# Patient Record
Sex: Female | Born: 1970
Health system: Southern US, Community
[De-identification: ages and names within clinical notes are randomized; demographics above are authoritative.]

## PROBLEM LIST (undated history)

## (undated) ENCOUNTER — Ambulatory Visit (HOSPITAL_COMMUNITY): Payer: Self-pay

## (undated) HISTORY — PX: ABDOMINAL HYSTERECTOMY: SHX81

---

## 2002-04-18 ENCOUNTER — Emergency Department (HOSPITAL_COMMUNITY): Admission: EM | Admit: 2002-04-18 | Discharge: 2002-04-18 | Payer: Self-pay | Admitting: *Deleted

## 2004-08-24 ENCOUNTER — Other Ambulatory Visit: Admission: RE | Admit: 2004-08-24 | Discharge: 2004-08-24 | Payer: Self-pay | Admitting: Gynecology

## 2004-10-27 ENCOUNTER — Inpatient Hospital Stay (HOSPITAL_COMMUNITY): Admission: RE | Admit: 2004-10-27 | Discharge: 2004-10-29 | Payer: Self-pay | Admitting: Gynecology

## 2004-10-27 ENCOUNTER — Encounter (INDEPENDENT_AMBULATORY_CARE_PROVIDER_SITE_OTHER): Payer: Self-pay | Admitting: *Deleted

## 2005-09-21 ENCOUNTER — Other Ambulatory Visit: Admission: RE | Admit: 2005-09-21 | Discharge: 2005-09-21 | Payer: Self-pay | Admitting: Gynecology

## 2006-09-29 ENCOUNTER — Other Ambulatory Visit: Admission: RE | Admit: 2006-09-29 | Discharge: 2006-09-29 | Payer: Self-pay | Admitting: Gynecology

## 2007-10-02 ENCOUNTER — Other Ambulatory Visit: Admission: RE | Admit: 2007-10-02 | Discharge: 2007-10-02 | Payer: Self-pay | Admitting: Gynecology

## 2008-08-16 ENCOUNTER — Ambulatory Visit: Payer: Self-pay | Admitting: Gynecology

## 2008-09-09 ENCOUNTER — Ambulatory Visit: Payer: Self-pay | Admitting: Gynecology

## 2008-11-19 ENCOUNTER — Encounter: Payer: Self-pay | Admitting: Gynecology

## 2008-11-19 ENCOUNTER — Ambulatory Visit: Payer: Self-pay | Admitting: Gynecology

## 2008-11-19 ENCOUNTER — Other Ambulatory Visit: Admission: RE | Admit: 2008-11-19 | Discharge: 2008-11-19 | Payer: Self-pay | Admitting: Gynecology

## 2008-11-25 ENCOUNTER — Ambulatory Visit: Payer: Self-pay | Admitting: Gynecology

## 2008-12-02 ENCOUNTER — Ambulatory Visit: Payer: Self-pay | Admitting: Gynecology

## 2011-03-05 NOTE — Discharge Summary (Signed)
Emily [name], Emily [name]                ACCOUNT NO.:  [id number]   MEDICAL RECORD NO.:  [id number]          PATIENT TYPE:  INP   LOCATION:  0451                         FACILITY:  [facility]   PHYSICIAN:  Timothy P. Fontaine, M.D.DATE OF BIRTH:  17-Jul-1971   DATE OF ADMISSION:  10/27/2004  DATE OF DISCHARGE:  10/29/2004                                 DISCHARGE SUMMARY   DISCHARGE DIAGNOSES:  Bilateral endometriotic cysts.   PROCEDURE:  Exploratory laparotomy, bilateral ovarian cystectomy October 27, 2004.   PATHOLOGY:  [id number], right ovarian cystectomy findings consistent with  endometriotic cyst; left ovarian cystectomy findings consistent with  endometriotic cyst.   HOSPITAL COURSE:  The patient underwent above operation without  complications. The patient did have a temperature of 101.7 the evening of  the surgery and remained afebrile since and was thought to be consistent  with postoperative changes. Her hemoglobin was 10.3, white count 8.4 the  morning following surgery. The patient was discharged on postoperative day  #2 ambulating well, tolerating a regular diet, received a prescription for  Tylox #20, 1-2 p.o. q.4-6 hours p.r.n. pain and will be seen in the office  in two weeks following surgery.      TPF/MEDQ  D:  12/22/2004  T:  12/22/2004  Job:  [id number]

## 2011-03-05 NOTE — Op Note (Signed)
[name], [name]                ACCOUNT NO.:  [id number]   MEDICAL RECORD NO.:  [id number]          PATIENT TYPE:  INP   LOCATION:  0007                         FACILITY:  [facility]   PHYSICIAN:  Timothy P. Fontaine, M.D.DATE OF BIRTH:  03/12/1971   DATE OF PROCEDURE:  10/27/2004  DATE OF DISCHARGE:                                 OPERATIVE REPORT   PREOPERATIVE DIAGNOSIS:  Bilateral ovarian cysts.   POSTOPERATIVE DIAGNOSES:  Endometriosis, with bilateral ovarian  endometriomas.   PROCEDURE:  Exploratory laparotomy, bilateral ovarian cystectomy.   SURGEON:  Timothy P. Fontaine, M.D.   ASSISTANT:  Rande Brunt. Eda Paschal, M.D.   ANESTHESIA:  General.   ESTIMATED BLOOD LOSS:  Approximately 100 cc.   COMPLICATIONS:  None.   SPECIMENS:  1.  Right ovarian cyst wall.  Left ovarian cyst wall.  2.  Opening cell washings.   FINDINGS:  A 12 cm right ovarian endometrioma.  A 3 cm left ovarian  endometrioma.  Uterus retroverted, with some fixation in the posterior cul-  de-sac.  No overt gross endometriosis remaining.  Right and left fallopian  tubes normal length, caliber, fimbriated ends.  Appendix grossly normal.  Upper abdominal digital exam grossly normal.   PROCEDURE:  The patient was taken to the operating room, underwent general  anesthesia, was placed in the supine position, and received and received an  abdominal perineal, and vaginal preparation with Betadine solution.  Bladder  emptied with an indwelling Foley catheterization and sterile technique.  The  patient was draped in the usual fashion, and the abdomen was sharply entered  through a Pfannenstiel incision, achieving adequate hemostasis at all  levels.  Opening cell washing was then taken, and subsequently the right  ovary was delivered through the incision, requiring medial splitting of the  rectus muscles minimally.  The capsule was noted to be smooth.  It was  decided at this time to proceed with an ovarian  cystectomy.  The capsule was  sharply incised, and the cyst wall was progressively dissected from the  ovarian stroma to the level of its most inferior attachment.  At this point,  it became evident that this area would have to be entered to be able to  safely remove it from the ovary.  A small incision was made into the cyst  wall, and subsequent chocolate material extruded, consistent with an  endometrioma.  This was evacuated, and subsequently the cyst wall was  dissected free and excised from the ovarian stroma.  Attention was then  turned to the left ovary, with a 3 cm cyst encountered.  This was sharply  incised to again extrude chocolate material, and this cyst capsule was then  sharply excised from the left ovary in its entirety.  Electrocautery was  delivered to the ovarian stromal base to achieve ultimate hemostasis, and  the capsule spontaneously reapproximated itself, and those sutures were  applied.  The right ovarian stroma was then addressed, and the ovary stroma,  and capsule, which was extremely attenuated due to the size of the right  ovarian cyst, was reapproximated using 2-0 Vicryl in  intermittent  pursestring sutures within the ovarian stroma to reapproximate the ovary at  its capsule.  The pelvis was copiously irrigated, adequate hemostasis  visualized.  No gross evidence of endometriosis was noted, although the  uterus was retroverted.  On dislodgment from the cul-de-sac, it was noted to  be somewhat fixed.  Of note, following delivery of the right ovarian cystic  mass, subsequently a Balfour retractor and bladder blade were placed within  the incision, and the intestines were packed from the operative field.  At  this point of the surgery, after reapproximation of the right ovarian  capsule, the bowel packing was removed, upper abdominal exam and noted to be  normal, the appendix visualized and noted to be free and mobile, and  subsequently the Balfour retractor and  bladder blade were removed.  Intercede was placed around the right ovary.  It was not felt necessary on  the left, as this was approximating into the sidewall with the fimbriated  end of the tube overlying the ovarian capsule.  The fascia was then  reapproximated using 0 Vicryl suture in a running stitch starting at the  angle and meeting in the middle.  The subcutaneous tissues were irrigated.  Adequate hemostasis was achieved with electrocautery.  Skin reapproximated  using 4-0 Vicryl in a running subcuticular stitch using a Keith needle.  Steri-Strips and Benzoin applied.  Sterile dressing applied.  The patient  was awakened without difficulty and taken to the recovery room in good  condition, having tolerated the procedure well.     Timo   TPF/MEDQ  D:  10/27/2004  T:  10/27/2004  Job:  [id number]

## 2011-03-05 NOTE — H&P (Signed)
Emily [name], Emily [name]                ACCOUNT NO.:  [id number]   MEDICAL RECORD NO.:  [id number]          PATIENT TYPE:  INP   LOCATION:  NA                           FACILITY:  [facility]   PHYSICIAN:  Timothy P. Fontaine, M.D.DATE OF BIRTH:  April 30, 1971   DATE OF ADMISSION:  10/27/2004  DATE OF DISCHARGE:                                HISTORY & PHYSICAL   CHIEF COMPLAINT:  Pelvic mass.   HISTORY OF PRESENT ILLNESS:  A 40 year old G0, initially evaluated in  November, complaining of increasing dysmenorrhea, menorrhagia.  Her initial  examination showed a bulky 12 week uterus, and she subsequently underwent  ultrasound and sonohistogram which showed a normal-appearing uterus,  negative endometrial cavity, a large 11 cm low level echofilled right  ovarian cystic mass, and a 46 mm left ovarian cystic mass with low level.  She had a followup ultrasound which confirmed persistence of both cystic  changes.  She had a CA-125 which was 194, and a negative CEA.  The patient  is admitted at this time for an exploratory laparotomy, ovarian cystectomy.   PAST MEDICAL HISTORY:  Uncomplicated.   PAST SURGICAL HISTORY:  None.   ALLERGIES:  No known drug allergies.   REVIEW OF SYSTEMS:  Noncontributory.   SOCIAL HISTORY:  Noncontributory.   FAMILY HISTORY:  Noncontributory.   CURRENT MEDICATIONS:  1.  Minocycline for acne.  2.  Zyrtec p.r.n. for allergies.   PHYSICAL EXAMINATION:  VITAL SIGNS:  Afebrile, vital signs are stable.  HEENT:  Normal.  LUNGS:  Clear.  CARDIAC:  Regular rate without murmurs, rubs, or gallops.  ABDOMEN:  With a palpable suprapubic pelvic mass.  PELVIC:  Bimanual shows a bulky 12 week size pelvic mass.  Tender to  manipulation.   ASSESSMENT AND PLAN:  A 40 year old, G0, history of increasing menorrhagia.  Ultrasound reveals bilateral cystic ovarian masses.  Presumptive diagnosis  of endometriosis.  The patient also has an elevated CA-125, and is now  experiencing  lower pelvic pain on a daily basis.  The patient is admitted at  this time for exploratory laparotomy and ovarian cystectomy, possible  salpingo-oophorectomy up to including total abdominal hysterectomy and  bilateral salpingo-oophorectomy, major cancer operation, including  omentectomy, lymph node sampling, and resection of any gross disease.  I  reviewed with the patient both benign and malignant possibilities.  I think  the most likely diagnosis is an endometriosis versus thermoid, although with  the elevated CA-125, realistic concern for ovarian carcinoma.  The patient  understands that the gynecologist oncologist is on stand-by.  We will also  plan on bowel prepping and antibiotics prophylactically.  I reviewed with  the patient in detail the proposed surgery that we will plan on a  Pfannenstiel incision, exploratory laparotomy, and assessing for pathology.  It is overtly appears to be endometriosis, we will attempt cystectomy  bilaterally, although given the size particularly of the right cystic mass,  that realistically a salpingo-oophorectomy more than likely will be  required.  The patient understands and accepts this.  She also understands  that if ovarian cancer is encountered, that  it appears to only involve the  right ovary, there is no other evidence of disease, and with the  gynecologist oncologist approval we would plan on a conservative surgery,  leave her uterus and other adnexa for potential child-bearing, and consider  staging such as omentectomy, lymph node sampling.  Then on final pathology  after all information is available, make decisions as to the recommended  therapy.  If it appears that ovarian cancer has spread overtly, then the  recommended procedure is total abdominal hysterectomy, bilateral salpingo-  oophorectomy, omentectomy, lymph node sampling, possible ostomy formation,  and these are the gynecologic oncologist recommendations, that we will  proceed with  this surgery.  The patient understands and is prepared for  this.  She clearly understands that if indeed we do proceed with  hysterectomy, that this is absolute and irreversible sterility.  Given her  nulliparous status, she clearly understands this and approves of the  surgery.  I discussed the risks of the surgery to include bleeding,  hemorrhage, transfusion, the risk of infection both internal requiring  prolonged antibiotics, sepsis formation requiring re-operation, abscess  drainage, as well as complications involving the incision requiring opening  and draining of the incision, closure by secondary intention.  The risks of  overt injury to internal organs, including bowel, bladder, ureters, vessels,  and nerves, necessitating major exploratory reparative surgeries and future  reparative surgeries, including ostomy formation was all discussed,  understood, and accepted.  The issues of surgical staging were reviewed, the  possibilities of lymph node sampling, the risk of lymph node sampling  reviewed to include vascular, nural, as well as lymphedema issues.  I again  stressed and discussed the absolute sterility potential and she understands  and accepts this.  Lastly, we discussed that if both ovaries are removed,  that this is a hypoestrogenic state and the potential for estrogen  replacement therapy with its potential risks, including thrombotic, as well  as breast cancer issues, were all discussed with her.  The symptoms of  hypoestrogenism were also reviewed.  The patient's questions were answered  to her satisfaction.  She is ready to proceed with surgery.     Timo   TPF/MEDQ  D:  10/25/2004  T:  10/25/2004  Job:  [id number]

## 2015-02-25 ENCOUNTER — Telehealth: Payer: Self-pay | Admitting: Internal Medicine

## 2015-02-25 NOTE — Telephone Encounter (Signed)
Left message for pt to call back to get appt time and date to see the doctor.  DX: Iron deficiency anemia Referring: Osei-Bonsu

## 2015-02-27 ENCOUNTER — Telehealth: Payer: Self-pay | Admitting: Oncology

## 2015-02-27 NOTE — Telephone Encounter (Signed)
Left second message regarding new pt appt.

## 2015-03-06 ENCOUNTER — Telehealth: Payer: Self-pay | Admitting: Oncology

## 2015-03-06 NOTE — Telephone Encounter (Signed)
Called pt third time and left message.   Called referring office to notify them of unsuccessful attempts to reach patient.

## 2016-10-25 ENCOUNTER — Ambulatory Visit (INDEPENDENT_AMBULATORY_CARE_PROVIDER_SITE_OTHER): Payer: Self-pay

## 2016-10-25 ENCOUNTER — Ambulatory Visit (INDEPENDENT_AMBULATORY_CARE_PROVIDER_SITE_OTHER): Payer: Self-pay | Admitting: Family Medicine

## 2016-10-25 VITALS — BP 160/90 | HR 72 | Temp 97.6°F | Resp 18 | Ht 70.25 in | Wt 135.0 lb

## 2016-10-25 DIAGNOSIS — M546 Pain in thoracic spine: Secondary | ICD-10-CM

## 2016-10-25 DIAGNOSIS — M25512 Pain in left shoulder: Secondary | ICD-10-CM | POA: Insufficient documentation

## 2016-10-25 DIAGNOSIS — M542 Cervicalgia: Secondary | ICD-10-CM

## 2016-10-25 MED ORDER — MELOXICAM 15 MG PO TABS: 15.0000 mg | ORAL_TABLET | Freq: Every day | ORAL | 0 refills | Status: DC

## 2016-10-25 MED ORDER — CYCLOBENZAPRINE HCL 10 MG PO TABS: 10.0000 mg | ORAL_TABLET | Freq: Every day | ORAL | 0 refills | Status: DC

## 2016-10-25 NOTE — Assessment & Plan Note (Signed)
X-rays are negative. Most likely will continue to improve with time.  - mobic and flexeril
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
## 2016-10-25 NOTE — Assessment & Plan Note (Signed)
No suggestion of cuff tear. No suggestion of clavicle fracture. Normal rotation  - mobic and flexion

## 2016-10-25 NOTE — Assessment & Plan Note (Signed)
Most likely this is related to a musculature in nature. X-rays are negative for fracture  - flexeril and mobic.  - given indications for return.
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
## 2016-11-05 ENCOUNTER — Ambulatory Visit (INDEPENDENT_AMBULATORY_CARE_PROVIDER_SITE_OTHER): Payer: Self-pay | Admitting: Physician Assistant

## 2016-11-05 VITALS — BP 124/82 | HR 90 | Temp 98.1°F | Resp 16 | Ht 60.0 in | Wt 138.0 lb

## 2016-11-05 DIAGNOSIS — M546 Pain in thoracic spine: Secondary | ICD-10-CM

## 2016-11-05 DIAGNOSIS — R202 Paresthesia of skin: Secondary | ICD-10-CM

## 2016-11-05 DIAGNOSIS — M542 Cervicalgia: Secondary | ICD-10-CM

## 2016-11-05 DIAGNOSIS — Z131 Encounter for screening for diabetes mellitus: Secondary | ICD-10-CM

## 2016-11-05 LAB — GLUCOSE, POCT (MANUAL RESULT ENTRY): POC Glucose: 66 mg/dl — AB (ref 70–99)

## 2016-11-05 MED ORDER — PREDNISONE 20 MG PO TABS: ORAL_TABLET | ORAL | 0 refills | Status: AC

## 2016-11-05 MED ORDER — CYCLOBENZAPRINE HCL 10 MG PO TABS: 10.0000 mg | ORAL_TABLET | Freq: Two times a day (BID) | ORAL | 0 refills | Status: DC
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
## 2016-11-05 NOTE — Patient Instructions (Addendum)
Stop taking meloxicam.  Start prednisone and continue flexeril.  Come back in 10 days if you are not feeling better so I can re-evaluate you and likely send you to orthopedics.     IF you received an x-ray today, you will receive an invoice from [facility]. Please contact [facility] at [phone] with questions or concerns regarding your invoice.   IF you received labwork today, you will receive an invoice from [facility]. Please contact [facility] at [phone] with questions or concerns regarding your invoice.   Our billing staff will not be able to assist you with questions regarding bills from these companies.  You will be contacted with the lab results as soon as they are available. The fastest way to get your results is to activate your My Chart account. Instructions are located on the last page of this paperwork. If you have not heard from us regarding the results in 2 weeks, please contact this office.

## 2016-11-15 ENCOUNTER — Other Ambulatory Visit: Payer: Self-pay | Admitting: Physician Assistant

## 2016-11-15 ENCOUNTER — Ambulatory Visit (INDEPENDENT_AMBULATORY_CARE_PROVIDER_SITE_OTHER): Payer: Self-pay | Admitting: Family Medicine

## 2016-11-15 VITALS — BP 130/82 | HR 87 | Temp 97.4°F | Ht 60.0 in | Wt 137.0 lb

## 2016-11-15 DIAGNOSIS — M546 Pain in thoracic spine: Secondary | ICD-10-CM

## 2016-11-15 DIAGNOSIS — R202 Paresthesia of skin: Secondary | ICD-10-CM

## 2016-11-15 NOTE — Assessment & Plan Note (Signed)
Has continued pain but still appears to be muscular in nature. The burning sensation may give concern for an underlying nerve irritation or complex regional pain syndrome.  - referral to PT  - follow up in 3-4 weeks. If no improvement could start gabapentin vs MRI of thoracic spine looking for nerve involvement.

## 2016-11-15 NOTE — Progress Notes (Signed)
     Subjective:    Patient ID: Emily Green, female    DOB: 02/27/1971, 46 y.o.   MRN: [id number]  Chief Complaint  Patient presents with  . Follow-up    on hand and back/neck pain    PCP: No primary care provider on file.  HPI  This is a 46 y.o. female who is presenting with thoracic back pain. There is a burning. She has been placed on mobic, flexeril and prednisone. The pain is midline and localized. The pain has gotten somewhat better. She stopped the mobic. The pain is all day. The pain is the same all day. She improvement with taking flexeril. She denies any changing of her skin color or sweating in that area. She denies any hand paresthesia today. She works at a [employer].   Review of Systems  ROS: No unexpected weight loss, fever, chills, swelling, instability, numbness/tingling, redness, otherwise see HPI   PMH: none  PShx: fibroid removal  PSx: no tobacco or alcohol use  FHx: stroke in mother   Patient Active Problem List   Diagnosis Date Noted  . Neck pain 10/25/2016  . Acute midline thoracic back pain 10/25/2016     Prior to Admission medications   Medication Sig Start Date End Date Taking? Authorizing Provider  cyclobenzaprine (FLEXERIL) 10 MG tablet Take 1 tablet (10 mg total) by mouth 2 (two) times daily. 11/05/16   Ofilia NeasMichael L Clark, PA-C     No Known Allergies    Objective:   Physical Exam BP 130/82 (BP Location: Right Arm, Patient Position: Sitting, Cuff Size: Small)   Pulse 87   Temp 97.4 F (36.3 C) (Oral)   Ht 5' (1.524 m)   Wt 137 lb (62.1 kg)   SpO2 97%   BMI 26.76 kg/m  Gen: NAD, alert, cooperative with exam, well-appearing HEENT: NCAT,  clear conjunctiva,  CV: no edema, capillary refill brisk  Resp:  non-labored Skin: no rashes, normal turgor  Neuro: no gross deficits.  Psych:  alert and oriented Back:  No ttp of the midline thoracic back  Normal scapular function  Normal shoulder abduction and flexion  Normal strength to  resistance of the shoulders  Normal grip strength  Normal sensation in hands.  Normal UE strength to resistance.  Normal Neck flexion and extension Normal back flexion and extension.      Assessment & Plan:   Acute midline thoracic back pain Has continued pain but still appears to be muscular in nature. The burning sensation may give concern for an underlying nerve irritation or complex regional pain syndrome.  - referral to PT  - follow up in 3-4 weeks. If no improvement could start gabapentin vs MRI of thoracic spine looking for nerve involvement.

## 2016-11-15 NOTE — Patient Instructions (Addendum)
  Thank you for coming in,   I am sending you to physical therapy.   You can try increasing the flexeril from one time daily to two times. Please make sure that you don't get drowsy or sleepy if you do increase it. You can take one tablet in the morning and one at night.   Please follow up with me in 3 to 4 weeks if you still are not having any improvement.     Please feel free to call with any questions or concerns at any time, at [phone]. --Dr. Jordan LikesSchmitz    IF you received an x-ray today, you will receive an invoice from [facility]. Please contact [facility] at [phone] with questions or concerns regarding your invoice.   IF you received labwork today, you will receive an invoice from [facility]. Please contact [facility] at [phone] with questions or concerns regarding your invoice.   Our billing staff will not be able to assist you with questions regarding bills from these companies.  You will be contacted with the lab results as soon as they are available. The fastest way to get your results is to activate your My Chart account. Instructions are located on the last page of this paperwork. If you have not heard from us regarding the results in 2 weeks, please contact this office.

## 2016-11-16 MED ORDER — NAPROXEN SODIUM 550 MG PO TABS: 550.0000 mg | ORAL_TABLET | Freq: Two times a day (BID) | ORAL | 1 refills | Status: DC

## 2016-11-16 NOTE — Telephone Encounter (Signed)
Unable to leave VM as it is not set up. Refill of steroid course is not appropriate. Patient has seen Dr. Jordan LikesSchmitz and PA-Clark in Jan 2018. She has tried Mobic, Flexeril initially and then got started on steroid course. She returned to clinic yesterday 11/14/2016. Imaging has been negative up to this point. A referral to ortho is necessary now. In the meantime, patient can schedule APAP 500mg  every 6 hours. I will send a script for Anaprox 550mg  every 12 hours with food. She alternate between these two medications.

## 2016-11-21 ENCOUNTER — Other Ambulatory Visit: Payer: Self-pay | Admitting: Family Medicine

## 2016-11-21 DIAGNOSIS — M546 Pain in thoracic spine: Secondary | ICD-10-CM

## 2016-11-21 DIAGNOSIS — M542 Cervicalgia: Secondary | ICD-10-CM

## 2017-01-05 ENCOUNTER — Ambulatory Visit (INDEPENDENT_AMBULATORY_CARE_PROVIDER_SITE_OTHER): Payer: Self-pay | Admitting: Orthopaedic Surgery

## 2017-01-18 ENCOUNTER — Ambulatory Visit (INDEPENDENT_AMBULATORY_CARE_PROVIDER_SITE_OTHER): Payer: BLUE CROSS/BLUE SHIELD | Admitting: Family Medicine

## 2017-01-18 ENCOUNTER — Ambulatory Visit (INDEPENDENT_AMBULATORY_CARE_PROVIDER_SITE_OTHER): Payer: BLUE CROSS/BLUE SHIELD

## 2017-01-18 VITALS — BP 146/96 | HR 72 | Temp 98.0°F | Resp 16 | Ht 60.0 in | Wt 136.0 lb

## 2017-01-18 DIAGNOSIS — R109 Unspecified abdominal pain: Secondary | ICD-10-CM

## 2017-01-18 DIAGNOSIS — R1031 Right lower quadrant pain: Secondary | ICD-10-CM

## 2017-01-18 DIAGNOSIS — M545 Low back pain, unspecified: Secondary | ICD-10-CM

## 2017-01-18 LAB — POCT CBC
GRANULOCYTE PERCENT: 68 % (ref 37–80)
HCT, POC: 37.1 % — AB (ref 37.7–47.9)
Hemoglobin: 12.3 g/dL (ref 12.2–16.2)
Lymph, poc: 2.1 (ref 0.6–3.4)
MCH: 23.7 pg — AB (ref 27–31.2)
MCHC: 33.2 g/dL (ref 31.8–35.4)
MCV: 71.5 fL — AB (ref 80–97)
MID (CBC): 0.3 (ref 0–0.9)
MPV: 8.6 fL (ref 0–99.8)
POC GRANULOCYTE: 5.2 (ref 2–6.9)
POC LYMPH %: 27.6 % (ref 10–50)
POC MID %: 4.4 %M (ref 0–12)
Platelet Count, POC: 284 10*3/uL (ref 142–424)
RBC: 5.19 M/uL (ref 4.04–5.48)
RDW, POC: 13.5 %
WBC: 7.7 10*3/uL (ref 4.6–10.2)

## 2017-01-18 LAB — POC MICROSCOPIC URINALYSIS (UMFC): MUCUS RE: ABSENT

## 2017-01-18 LAB — POCT URINALYSIS DIP (MANUAL ENTRY)
BILIRUBIN UA: NEGATIVE
Bilirubin, UA: NEGATIVE
GLUCOSE UA: NEGATIVE
NITRITE UA: NEGATIVE
Protein Ur, POC: NEGATIVE
Spec Grav, UA: 1.02 (ref 1.030–1.035)
Urobilinogen, UA: 0.2 (ref ?–2.0)
pH, UA: 6.5 (ref 5.0–8.0)

## 2017-01-18 MED ORDER — NAPROXEN SODIUM 550 MG PO TABS: 550.0000 mg | ORAL_TABLET | Freq: Two times a day (BID) | ORAL | 1 refills | Status: DC

## 2017-01-18 MED ORDER — CYCLOBENZAPRINE HCL 5 MG PO TABS: 5.0000 mg | ORAL_TABLET | Freq: Every day | ORAL | 0 refills | Status: DC

## 2017-01-18 MED ORDER — TRAMADOL HCL 50 MG PO TABS: 50.0000 mg | ORAL_TABLET | Freq: Three times a day (TID) | ORAL | 0 refills | Status: DC | PRN

## 2017-01-18 NOTE — Progress Notes (Addendum)
Patient ID: Emily Green, female    DOB: 09-06-1971  Age: 46 y.o. MRN: [id number]  Chief Complaint  Patient presents with  . Abdominal Pain    Right lower quadrant, x 1 week  . Hypertension    Pt states BP is generally normal, it is only high when she is in pain  . Medication Refill    All, was prescribed for back pain     Subjective:   46 year old lady who is been having pain in the right lower quadrant/groin area for the past week. When she takes some Tylenol if it subsides, but then it keeps coming back. It wakens in the night even. It is just a steady aching pain in the right lower quadrant just above the groin. 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 She does not have any nausea or vomiting, diarrhea, constipation, dysuria, or bleeding. She has had a hysterectomy for fibroids. She has not had any injury. No fevers.  Current allergies, medications, problem list, past/family and social histories reviewed.  Objective:  BP (!) 146/96   Pulse 72   Temp 98 F (36.7 C) (Oral)   Resp 16   Ht 5' (1.524 m)   Wt 136 lb (61.7 kg)   SpO2 97%   BMI 26.56 kg/m   Throat clear. Neck supple without nodes. Chest clear. Heart regular without murmur. Abdomen soft without masses. Has normal bowel sounds. She is a little tender in the extreme right lower quadrant about the right hand in her transabdominal incision. No CVA tenderness. Good range of motion. Straight leg raising without tenderness.  Assessment & Plan:   Assessment: 1. RLQ abdominal pain   2. Right flank pain   3. Low back pain, non-specific       Plan: Studies as ordered IMPRESSION: No acute abnormality.  Moderate to moderately large stool burden throughout the colon.  Results for orders placed or performed in visit on 01/18/17  POCT CBC  Result Value Ref Range   WBC 7.7 4.6 - 10.2 K/uL   Lymph, poc 2.1 0.6 - 3.4   POC LYMPH PERCENT 27.6 10 - 50 %L   MID (cbc) 0.3 0 - 0.9   POC MID % 4.4 0 - 12 %M    POC Granulocyte 5.2 2 - 6.9   Granulocyte percent 68.0 37 - 80 %G   RBC 5.19 4.04 - 5.48 M/uL   Hemoglobin 12.3 12.2 - 16.2 g/dL   HCT, POC 40.9 (A) 81.1 - 47.9 %   MCV 71.5 (A) 80 - 97 fL   MCH, POC 23.7 (A) 27 - 31.2 pg   MCHC 33.2 31.8 - 35.4 g/dL   RDW, POC 91.4 %   Platelet Count, POC 284 142 - 424 K/uL   MPV 8.6 0 - 99.8 fL  POCT Microscopic Urinalysis ([facility])  Result Value Ref Range   WBC,UR,HPF,POC Moderate (A) None WBC/hpf   RBC,UR,HPF,POC None None RBC/hpf   Bacteria None None, Too numerous to count   Mucus Absent Absent   Epithelial Cells, UR Per Microscopy Moderate (A) None, Too numerous to count cells/hpf  POCT urinalysis dipstick  Result Value Ref Range   Color, UA yellow yellow   Clarity, UA clear clear   Glucose, UA negative negative   Bilirubin, UA negative negative   Ketones, POC UA negative negative   Spec Grav, UA 1.020 1.030 - 1.035   Blood, UA trace-intact (A) negative   pH, UA 6.5 5.0 - 8.0  Protein Ur, POC negative negative   Urobilinogen, UA 0.2 Negative - 2.0   Nitrite, UA Negative Negative   Leukocytes, UA small (1+) (A) Negative    Orders Placed This Encounter  Procedures  . DG Abd 2 Views    Standing Status:   Future    Number of Occurrences:   1    Standing Expiration Date:   01/18/2018    Order Specific Question:   Reason for Exam (SYMPTOM  OR DIAGNOSIS REQUIRED)    Answer:   rlq pain for 1 week    Order Specific Question:   Is the patient pregnant?    Answer:   No    Order Specific Question:   Preferred imaging location?    Answer:   External  . POCT CBC  . POCT Microscopic Urinalysis ([facility])  . POCT urinalysis dipstick    Meds ordered this encounter  Medications  . DISCONTD: cyclobenzaprine (FLEXERIL) 10 MG tablet    Refill:  0  . DISCONTD: naproxen sodium (ANAPROX) 550 MG tablet    Refill:  1  . traMADol (ULTRAM) 50 MG tablet    Sig: Take 1 tablet (50 mg total) by mouth every 8 (eight) hours as needed.    Dispense:  15  tablet    Refill:  0  . cyclobenzaprine (FLEXERIL) 5 MG tablet    Sig: Take 1 tablet (5 mg total) by mouth at bedtime.    Dispense:  30 tablet    Refill:  0  . naproxen sodium (ANAPROX) 550 MG tablet    Sig: Take 1 tablet (550 mg total) by mouth 2 (two) times daily with a meal.    Dispense:  30 tablet    Refill:  1         Patient Instructions   Your studies indicate that you still have a a lot of stool backed up in your colon even though your bowels have been moving.  We will place you on MiraLAX 1 dose daily which you can buy over-the-counter. If your stool start getting too loose decrease to every few days as needed to keep them moving very well. If the pain continues to persist please return and we will have to do some additional testing on you.  If pain is more severe you can take tramadol 1 pill every 8 hours only if needed.  With abdominal pain the warning signs to get checked immediately include increasing pain, fever, nausea and vomiting, passing blood, or anything else that may have you very concerned.  Use your cyclobenzaprine for muscle relaxant for your back only when needed. Too much of it will cause constipation. He will formally on 10 mg and I'm giving you 5 mg this time. Also only use the naproxen for the back when needed.  Monitor your blood pressure and if it is continuing to run high please return for a recheck. It is probably higher today because of your pain.  IF you received an x-ray today, you will receive an invoice from [facility]. Please contact [facility] at [phone] with questions or concerns regarding your invoice.   IF you received labwork today, you will receive an invoice from [place]. Please contact [facility] at [phone] with questions or concerns regarding your invoice.   Our billing staff will not be able to assist you with questions regarding bills from these companies.  You will be contacted with the lab results as  soon as they are available. The fastest way to get  your results is to activate your My Chart account. Instructions are located on the last page of this paperwork. If you have not heard from [place] regarding the results in 2 weeks, please contact this office.        Return if symptoms worsen or fail to improve.   Mounir Skipper, MD 01/18/2017

## 2017-01-18 NOTE — Addendum Note (Signed)
Addended by: Tashunda Vandezande H on: 01/18/2017 02:52 PM   Modules accepted: Orders

## 2017-01-18 NOTE — Patient Instructions (Addendum)
Your studies indicate that you still have a a lot of stool backed up in your colon even though your bowels have been moving.  We will place you on MiraLAX 1 dose daily which you can buy over-the-counter. If your stool start getting too loose decrease to every few days as needed to keep them moving very well. If the pain continues to persist please return and we will have to do some additional testing on you.  If pain is more severe you can take tramadol 1 pill every 8 hours only if needed.  With abdominal pain the warning signs to get checked immediately include increasing pain, fever, nausea and vomiting, passing blood, or anything else that may have you very concerned.  Use your cyclobenzaprine for muscle relaxant for your back only when needed. Too much of it will cause constipation. He will formally on 10 mg and I'm giving you 5 mg this time. Also only use the naproxen for the back when needed.  Monitor your blood pressure and if it is continuing to run high please return for a recheck. It is probably higher today because of your pain.  IF you received an x-ray today, you will receive an invoice from [facility]. Please contact [facility] at [phone] with questions or concerns regarding your invoice.   IF you received labwork today, you will receive an invoice from [place]. Please contact [facility] at [phone] with questions or concerns regarding your invoice.   Our billing staff will not be able to assist you with questions regarding bills from these companies.  You will be contacted with the lab results as soon as they are available. The fastest way to get your results is to activate your My Chart account. Instructions are located on the last page of this paperwork. If you have not heard from [place] regarding the results in 2 weeks, please contact this office.

## 2017-01-28 ENCOUNTER — Encounter (INDEPENDENT_AMBULATORY_CARE_PROVIDER_SITE_OTHER): Payer: Self-pay | Admitting: Orthopedic Surgery

## 2017-01-28 ENCOUNTER — Ambulatory Visit (INDEPENDENT_AMBULATORY_CARE_PROVIDER_SITE_OTHER): Payer: BLUE CROSS/BLUE SHIELD | Admitting: Orthopedic Surgery

## 2017-01-28 DIAGNOSIS — M546 Pain in thoracic spine: Secondary | ICD-10-CM

## 2017-01-28 DIAGNOSIS — M542 Cervicalgia: Secondary | ICD-10-CM
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
## 2017-02-01 ENCOUNTER — Ambulatory Visit (INDEPENDENT_AMBULATORY_CARE_PROVIDER_SITE_OTHER): Payer: Self-pay | Admitting: Orthopaedic Surgery

## 2017-02-13 ENCOUNTER — Ambulatory Visit
Admission: RE | Admit: 2017-02-13 | Discharge: 2017-02-13 | Disposition: A | Discharge status: Home / Self Care | Payer: Self-pay | Source: Ambulatory Visit | Attending: Orthopedic Surgery | Admitting: Orthopedic Surgery

## 2017-02-13 DIAGNOSIS — M546 Pain in thoracic spine: Secondary | ICD-10-CM

## 2017-02-16 ENCOUNTER — Ambulatory Visit (INDEPENDENT_AMBULATORY_CARE_PROVIDER_SITE_OTHER): Payer: BLUE CROSS/BLUE SHIELD | Admitting: Orthopedic Surgery

## 2017-02-16 ENCOUNTER — Encounter (INDEPENDENT_AMBULATORY_CARE_PROVIDER_SITE_OTHER): Payer: Self-pay | Admitting: Orthopedic Surgery

## 2017-02-16 DIAGNOSIS — M545 Low back pain, unspecified: Secondary | ICD-10-CM

## 2017-02-16 MED ORDER — CYCLOBENZAPRINE HCL 5 MG PO TABS: 5.0000 mg | ORAL_TABLET | Freq: Every day | ORAL | 0 refills | Status: DC

## 2017-02-16 MED ORDER — NAPROXEN SODIUM 550 MG PO TABS: 550.0000 mg | ORAL_TABLET | Freq: Two times a day (BID) | ORAL | 1 refills | Status: DC
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
## 2017-09-29 ENCOUNTER — Ambulatory Visit: Payer: BLUE CROSS/BLUE SHIELD | Admitting: Physician Assistant

## 2017-09-29 ENCOUNTER — Other Ambulatory Visit: Payer: Self-pay

## 2017-09-29 ENCOUNTER — Encounter: Payer: Self-pay | Admitting: Physician Assistant

## 2017-09-29 VITALS — BP 146/88 | HR 79 | Resp 16 | Ht 62.0 in | Wt 141.0 lb

## 2017-09-29 DIAGNOSIS — R51 Headache: Secondary | ICD-10-CM | POA: Diagnosis not present

## 2017-09-29 DIAGNOSIS — J3489 Other specified disorders of nose and nasal sinuses: Secondary | ICD-10-CM

## 2017-09-29 DIAGNOSIS — R718 Other abnormality of red blood cells: Secondary | ICD-10-CM

## 2017-09-29 DIAGNOSIS — R519 Headache, unspecified: Secondary | ICD-10-CM

## 2017-09-29 DIAGNOSIS — I10 Essential (primary) hypertension: Secondary | ICD-10-CM

## 2017-09-29 MED ORDER — NAPROXEN 500 MG PO TABS: 500.0000 mg | ORAL_TABLET | Freq: Two times a day (BID) | ORAL | 0 refills | Status: DC

## 2017-09-29 MED ORDER — DOXYCYCLINE HYCLATE 100 MG PO CAPS: 100.0000 mg | ORAL_CAPSULE | Freq: Two times a day (BID) | ORAL | 0 refills | Status: AC

## 2017-09-29 MED ORDER — AMLODIPINE BESYLATE 2.5 MG PO TABS: 2.5000 mg | ORAL_TABLET | Freq: Every day | ORAL | 0 refills | Status: DC

## 2017-09-29 NOTE — Patient Instructions (Addendum)
  Set an appointment for about two weeks.  COme back sooner if you are getting worse.  If you are suddenly very ill then please go to the emergency department.    IF you received an x-ray today, you will receive an invoice from [facility]. Please contact [facility] at [phone] with questions or concerns regarding your invoice.   IF you received labwork today, you will receive an invoice from [facility]. Please contact [facility] at [phone] with questions or concerns regarding your invoice.   Our billing staff will not be able to assist you with questions regarding bills from these companies.  You will be contacted with the lab results as soon as they are available. The fastest way to get your results is to activate your My Chart account. Instructions are located on the last page of this paperwork. If you have not heard from us regarding the results in 2 weeks, please contact this office.

## 2017-09-29 NOTE — Progress Notes (Signed)
09/29/2017 2:54 PM   DOB: Nov 25, 1970 / MRN: [id number]  SUBJECTIVE:  Emily LombardJennifer Rotunno is a 46 y.o. female presenting for a stabbing frontal HA started about 5 days ago and she feels that she is getting worse.  No preceding illness.  Associates some  lightheadedness with this HA. She is worried that it is her BP.  She took her pressure yesterday and tells me that it was 150.  Denies any neurological deficits.   She has No Known Allergies.   She  has no past medical history on file.    She  reports that  has never smoked. she has never used smokeless tobacco. She reports that she does not drink alcohol or use drugs. She  has no sexual activity history on file. The patient  has a past surgical history that includes Abdominal hysterectomy.  Her family history includes Stroke in her mother.  Review of Systems  Constitutional: Negative for chills and fever.  Gastrointestinal: Negative for nausea.  Skin: Negative for rash.  Neurological: Negative for dizziness.    The problem list and medications were reviewed and updated by myself where necessary and exist elsewhere in the encounter.   OBJECTIVE:  BP (!) 146/88 (BP Location: Left Arm, Patient Position: Sitting, Cuff Size: Normal)   Pulse 79   Resp 16   Ht 5\' 2"  (1.575 m)   Wt 141 lb (64 kg)   SpO2 98%   BMI 25.79 kg/m   BP Readings from Last 3 Encounters:  09/29/17 (!) 146/88  01/18/17 (!) 146/96  11/15/16 130/82     Physical Exam  Constitutional: She is oriented to person, place, and time. She is active.  Non-toxic appearance.  Cardiovascular: Normal rate, regular rhythm, S1 normal, S2 normal, normal heart sounds and intact distal pulses. Exam reveals no gallop, no friction rub and no decreased pulses.  No murmur heard. Pulmonary/Chest: Effort normal. No stridor. No tachypnea. No respiratory distress. She has no wheezes. She has no rales.  Abdominal: She exhibits no distension.  Musculoskeletal: Normal range of motion. She  exhibits no edema, tenderness or deformity.  Neurological: She is alert and oriented to person, place, and time. She has normal reflexes. She displays no atrophy, no tremor and normal reflexes. No cranial nerve deficit or sensory deficit. She exhibits normal muscle tone. She displays no seizure activity. Coordination and gait normal. GCS eye subscore is 4. GCS verbal subscore is 5. GCS motor subscore is 6.  Skin: Skin is warm and dry. She is not diaphoretic. No pallor.    No results found for this or any previous visit (from the past 72 hour(s)).  No results found.  ASSESSMENT AND PLAN:  Victorino DikeJennifer was seen today for headache.  Diagnoses and all orders for this visit:  Acute nonintractable headache, unspecified headache type -     CBC -     TSH -     Hemoglobin A1c -     Basic metabolic panel -     Hepatic function panel -     naproxen (NAPROSYN) 500 MG tablet; Take 1 tablet (500 mg total) by mouth 2 (two) times daily with a meal.  Frontal sinus pain -     doxycycline (VIBRAMYCIN) 100 MG capsule; Take 1 capsule (100 mg total) by mouth 2 (two) times daily for 10 days.  Uncontrolled hypertension, stage 1 -     amLODipine (NORVASC) 2.5 MG tablet; Take 1 tablet (2.5 mg total) by mouth daily.    The  patient is advised to call or return to clinic if she does not see an improvement in symptoms, or to seek the care of the closest emergency department if she worsens with the above plan.   Deliah BostonMichael Clark, MHS, PA-C [facility] 09/29/2017 2:54 PM

## 2017-09-30 LAB — BASIC METABOLIC PANEL
BUN/Creatinine Ratio: 14 (ref 9–23)
BUN: 12 mg/dL (ref 6–24)
CHLORIDE: 101 mmol/L (ref 96–106)
CO2: 25 mmol/L (ref 20–29)
Calcium: 9.7 mg/dL (ref 8.7–10.2)
Creatinine, Ser: 0.88 mg/dL (ref 0.57–1.00)
GFR calc Af Amer: 92 mL/min/{1.73_m2} (ref >59)
GFR calc non Af Amer: 80 mL/min/{1.73_m2} (ref >59)
GLUCOSE: 95 mg/dL (ref 65–99)
POTASSIUM: 3.9 mmol/L (ref 3.5–5.2)
SODIUM: 140 mmol/L (ref 134–144)

## 2017-09-30 LAB — HEPATIC FUNCTION PANEL
ALBUMIN: 4.6 g/dL (ref 3.5–5.5)
ALT: 16 IU/L (ref 0–32)
AST: 24 IU/L (ref 0–40)
Alkaline Phosphatase: 73 IU/L (ref 39–117)
Bilirubin Total: 0.7 mg/dL (ref 0.0–1.2)
Bilirubin, Direct: 0.17 mg/dL (ref 0.00–0.40)
Total Protein: 7.8 g/dL (ref 6.0–8.5)

## 2017-09-30 LAB — CBC
HEMATOCRIT: 39.4 % (ref 34.0–46.6)
HEMOGLOBIN: 12.3 g/dL (ref 11.1–15.9)
MCH: 23 pg — ABNORMAL LOW (ref 26.6–33.0)
MCHC: 31.2 g/dL — ABNORMAL LOW (ref 31.5–35.7)
MCV: 74 fL — ABNORMAL LOW (ref 79–97)
Platelets: 312 10*3/uL (ref 150–379)
RBC: 5.35 x10E6/uL — AB (ref 3.77–5.28)
RDW: 15.6 % — ABNORMAL HIGH (ref 12.3–15.4)
WBC: 8.3 10*3/uL (ref 3.4–10.8)

## 2017-09-30 LAB — HEMOGLOBIN A1C
ESTIMATED AVERAGE GLUCOSE: 85 mg/dL
HEMOGLOBIN A1C: 4.6 % — AB (ref 4.8–5.6)

## 2017-09-30 LAB — TSH: TSH: 1.7 u[IU]/mL (ref 0.450–4.500)

## 2017-09-30 NOTE — Progress Notes (Signed)
Microcytosis

## 2017-09-30 NOTE — Addendum Note (Signed)
Addended by: Baldwin CrownJOHNSON, Jannel Lynne D on: 09/30/2017 09:48 AM   Modules accepted: Orders

## 2017-10-01 LAB — IRON,TIBC AND FERRITIN PANEL
FERRITIN: 296 ng/mL — AB (ref 15–150)
IRON: 77 ug/dL (ref 27–159)
Iron Saturation: 28 % (ref 15–55)
TIBC: 271 ug/dL (ref 250–450)
UIBC: 194 ug/dL (ref 131–425)

## 2017-10-13 ENCOUNTER — Other Ambulatory Visit: Payer: Self-pay

## 2017-10-13 ENCOUNTER — Encounter: Payer: Self-pay | Admitting: Physician Assistant

## 2017-10-13 ENCOUNTER — Ambulatory Visit: Payer: BLUE CROSS/BLUE SHIELD | Admitting: Physician Assistant

## 2017-10-13 VITALS — BP 110/68 | HR 64 | Resp 16 | Ht 62.0 in | Wt 140.8 lb

## 2017-10-13 DIAGNOSIS — I10 Essential (primary) hypertension: Secondary | ICD-10-CM

## 2017-10-13 MED ORDER — AMLODIPINE BESYLATE 2.5 MG PO TABS: 2.5000 mg | ORAL_TABLET | Freq: Every day | ORAL | 3 refills | Status: DC

## 2017-10-13 NOTE — Progress Notes (Signed)
10/13/2017 9:38 AM   DOB: November 21, 1970 / MRN: [id number]  SUBJECTIVE:  Emily Green is a 46 y.o. female presenting for recheck of multiple problems.  Tells me her headache has largely resolved. She has been taking doxycycline to treat a sinus infection as well as Naprosyn as needed.    Has been taking Norvasc 2.5 daily with good BP reduction.  Denies any side effects with this today.   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   She  has no past medical history on file.    She  reports that  has never smoked. she has never used smokeless tobacco. She reports that she does not drink alcohol or use drugs. She  has no sexual activity history on file. The patient  has a past surgical history that includes Abdominal hysterectomy.  Her family history includes Stroke in her mother.  Review of Systems  Constitutional: Negative for chills and fever.  Respiratory: Negative for cough.   Cardiovascular: Negative for chest pain.  Gastrointestinal: Negative for nausea.  Skin: Negative for itching and rash.  Neurological: Negative for tingling, tremors, sensory change, speech change, focal weakness, seizures, loss of consciousness and headaches.    The problem list and medications were reviewed and updated by myself where necessary and exist elsewhere in the encounter.   OBJECTIVE:  BP 110/68 (BP Location: Right Arm, Patient Position: Sitting, Cuff Size: Normal)   Pulse 64   Resp 16   Ht 5\' 2"  (1.575 m)   Wt 140 lb 12.8 oz (63.9 kg)   SpO2 98%   BMI 25.75 kg/m   Physical Exam  Constitutional: She is oriented to person, place, and time. She is active.  Non-toxic appearance.  Eyes: EOM are normal. Pupils are equal, round, and reactive to light.  Cardiovascular: Normal rate, regular rhythm, S1 normal, S2 normal, normal heart sounds and intact distal pulses. Exam reveals no gallop, no friction rub and  no decreased pulses.  No murmur heard. Pulmonary/Chest: Effort normal. No stridor. No tachypnea. No respiratory distress. She has no wheezes. She has no rales.  Abdominal: She exhibits no distension.  Musculoskeletal: She exhibits no edema.  Neurological: She is alert and oriented to person, place, and time. She has normal strength and normal reflexes. She is not disoriented. She displays no atrophy. No cranial nerve deficit or sensory deficit. She exhibits normal muscle tone. Coordination and gait normal.  Skin: Skin is warm and dry. She is not diaphoretic. No pallor.  Psychiatric: Her behavior is normal.    Lab Results  Component Value Date   WBC 8.3 09/29/2017   HGB 12.3 09/29/2017   HCT 39.4 09/29/2017   MCV 74 (L) 09/29/2017   PLT 312 09/29/2017    Lab Results  Component Value Date   CREATININE 0.88 09/29/2017   BUN 12 09/29/2017   NA 140 09/29/2017   K 3.9 09/29/2017   CL 101 09/29/2017   CO2 25 09/29/2017    Lab Results  Component Value Date   ALT 16 09/29/2017   AST 24 09/29/2017   ALKPHOS 73 09/29/2017   BILITOT 0.7 09/29/2017    Lab Results  Component Value Date   TSH 1.700 09/29/2017    Lab Results  Component Value Date   HGBA1C 4.6 (L) 09/29/2017   No results found for this or any previous visit (from the past 72  hour(s)).  No results found.  ASSESSMENT AND PLAN:  Emily Green was seen today for follow-up.  Diagnoses and all orders for this visit:  Well-controlled hypertension: Headache has resolved.  She will come back if she has another dizzy spell as before.  Will continue her norvasc. Follow up in 6 month for recheck.  -     amLODipine (NORVASC) 2.5 MG tablet; Take 1 tablet (2.5 mg total) by mouth daily. -     Lipid Panel    The patient is advised to call or return to clinic if she does not see an improvement in symptoms, or to seek the care of the closest emergency department if she worsens with the above plan.   Deliah BostonMichael Clark, MHS,  PA-C Primary Care at [facility] 10/13/2017 9:38 AM

## 2017-10-14 LAB — LIPID PANEL
CHOLESTEROL TOTAL: 155 mg/dL (ref 100–199)
Chol/HDL Ratio: 4.6 ratio — ABNORMAL HIGH (ref 0.0–4.4)
HDL: 34 mg/dL — AB (ref >39)
LDL Calculated: 69 mg/dL (ref 0–99)
Triglycerides: 259 mg/dL — ABNORMAL HIGH (ref 0–149)
VLDL CHOLESTEROL CAL: 52 mg/dL — AB (ref 5–40)
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
## 2018-04-05 ENCOUNTER — Other Ambulatory Visit: Payer: Self-pay

## 2018-04-05 ENCOUNTER — Ambulatory Visit: Payer: BLUE CROSS/BLUE SHIELD | Admitting: Physician Assistant

## 2018-04-05 ENCOUNTER — Telehealth: Payer: Self-pay | Admitting: *Deleted

## 2018-04-05 ENCOUNTER — Encounter: Payer: Self-pay | Admitting: Physician Assistant

## 2018-04-05 VITALS — BP 106/76 | HR 80 | Temp 98.3°F | Resp 16 | Ht 60.0 in | Wt 141.0 lb

## 2018-04-05 DIAGNOSIS — J22 Unspecified acute lower respiratory infection: Secondary | ICD-10-CM

## 2018-04-05 DIAGNOSIS — R05 Cough: Secondary | ICD-10-CM | POA: Diagnosis not present

## 2018-04-05 DIAGNOSIS — R059 Cough, unspecified: Secondary | ICD-10-CM

## 2018-04-05 MED ORDER — AZITHROMYCIN 250 MG PO TABS: ORAL_TABLET | ORAL | 0 refills | Status: DC

## 2018-04-05 MED ORDER — FLUTICASONE PROPIONATE 50 MCG/ACT NA SUSP: 2.0000 | Freq: Every day | NASAL | 12 refills | Status: AC

## 2018-04-05 MED ORDER — BENZONATATE 100 MG PO CAPS: 100.0000 mg | ORAL_CAPSULE | Freq: Three times a day (TID) | ORAL | 0 refills | Status: DC | PRN

## 2018-04-05 MED ORDER — PROMETHAZINE VC/CODEINE 6.25-5-10 MG/5ML PO SYRP: 1.0000 | ORAL_SOLUTION | Freq: Four times a day (QID) | ORAL | 0 refills | Status: DC

## 2018-04-05 NOTE — Telephone Encounter (Signed)
Called pharmacy [facility]

## 2018-04-05 NOTE — Patient Instructions (Addendum)
Azithromycin is an antibiotic. Take all 5 days of this medicine, even if you start to feel better sooner.   Post nasal drip may be contributing to your cough. Use the flonase nasal spray 2 sprays each nostril twice daily.  Tessalon (benzonatate) is for cough as needed.  Promethazine-hycodan is a cough syrup. This may make you drowsy. Do not drive while taking this medicine. Take this as needed.   Stay well hydrated - drink 32-64 oz water/daily. Get lost of rest. Wash your hands often.     -Foods that can help speed recovery: honey, garlic, chicken soup, elderberries, green tea.  -Supplements that can help speed recovery: vitamin C, zinc, elderberry extract, quercetin, ginseng, selenium -Supplement with prebiotics and probiotics:   For sore throat try using a honey-based tea. Use 3 teaspoons of honey with juice squeezed from half lemon. Place shaved pieces of ginger into 1/2-1 cup of water and warm over stove top. Then mix the ingredients and repeat every 4 hours as needed.  Cough Syrup Recipe: Sweet Lemon & Honey Thyme  Ingredients a handful of fresh thyme sprigs   1 pint of water (2 cups)  1/2 cup honey (raw is best, but regular will do)  1/2 lemon chopped Instructions 1. Place the lemon in the pint jar and cover with the honey. The honey will macerate the lemons and draw out liquids which taste so delicious! 2. Meanwhile, toss the thyme leaves into a saucepan and cover them with the water. 3. Bring the water to a gentle simmer and reduce it to half, about a cup of tea. 4. When the tea is reduced and cooled a bit, strain the sprigs & leaves, add it into the pint jar and stir it well. 5. Give it a shake and use a spoonful as needed. 6. Store your homemade cough syrup in the refrigerator for about a month.   Is there anything I can do on my own to get rid of my cough? Yes. To help get rid of your cough, you can: ?Use a humidifier in your bedroom ?Use an over-the-counter cough  medicine, or suck on cough drops or hard candy ?Stop smoking, if you smoke ?If you have allergies, avoid the things you are allergic to (like pollen, dust, animals, or mold) If you have acid reflux, your doctor or nurse will tell you which lifestyle changes can help reduce symptoms.     Thank you for coming in today. I hope you feel we met your needs.  Feel free to call PCP if you have any questions or further requests.  Please consider signing up for MyChart if you do not already have it, as this is a great way to communicate with me.  Best,  Emily McVey, PA-C   IF you received an x-ray today, you will receive an invoice from [facility]. Please contact [facility] at [phone] with questions or concerns regarding your invoice.   IF you received labwork today, you will receive an invoice from [facility]. Please contact [facility] at [phone] with questions or concerns regarding your invoice.   Our billing staff will not be able to assist you with questions regarding bills from these companies.  You will be contacted with the lab results as soon as they are available. The fastest way to get your results is to activate your My Chart account. Instructions are located on the last page of this paperwork. If you have not heard from [place] regarding the results in 2 weeks, please  contact this office.

## 2018-04-05 NOTE — Progress Notes (Signed)
   Emily LombardJennifer Green  MRN: [id number] DOB: 07/14/1971  PCP: Patient, No Pcp Per  Subjective:  Pt is a 47 year old female who presents to clinic for cough x 1 month. Cough is productive with white to yellow mucus.  Cough is not as bad at night.  She tried otc medications, which are not working. Cannot recall what she has taken.  Denies fever, chills, sneezing, runny nose, ear pain, diaphoresis, night sweats, chest pain, palpitations, reflux symptoms She does not smoke.   Review of Systems  Constitutional: Negative for chills, diaphoresis, fatigue and fever.  HENT: Positive for congestion. Negative for postnasal drip, rhinorrhea, sinus pressure, sinus pain and sore throat.   Respiratory: Positive for cough. Negative for shortness of breath and wheezing.   Psychiatric/Behavioral: Negative for sleep disturbance.    Patient Active Problem List   Diagnosis Date Noted  . Hypertension, well controlled 10/13/2017    Current Outpatient Medications on File Prior to Visit  Medication Sig Dispense Refill  . amLODipine (NORVASC) 2.5 MG tablet Take 1 tablet (2.5 mg total) by mouth daily. 90 tablet 3   No current facility-administered medications on file prior to visit.     No Known Allergies   Objective:  BP 106/76 (BP Location: Left Arm, Patient Position: Sitting, Cuff Size: Normal)   Pulse 80   Temp 98.3 F (36.8 C) (Oral)   Resp 16   Ht 5' (1.524 m)   Wt 141 lb (64 kg)   SpO2 97%   BMI 27.54 kg/m   Physical Exam  Constitutional: She is oriented to person, place, and time. No distress.  HENT:  Right Ear: Tympanic membrane normal.  Left Ear: Tympanic membrane normal.  Nose: Mucosal edema present. No rhinorrhea. Right sinus exhibits no maxillary sinus tenderness and no frontal sinus tenderness. Left sinus exhibits no maxillary sinus tenderness and no frontal sinus tenderness.  Mouth/Throat: Oropharynx is clear and moist and mucous membranes are normal.  Neck: Normal range of motion.  Neck supple.  Cardiovascular: Normal rate, regular rhythm and normal heart sounds.  Pulmonary/Chest: Effort normal and breath sounds normal. No respiratory distress. She has no wheezes. She has no rales.  Lymphadenopathy:    She has no cervical adenopathy.  Neurological: She is alert and oriented to person, place, and time.  Skin: Skin is warm and dry.  Psychiatric: Judgment normal.  Vitals reviewed.   Assessment and Plan :  1. Lower respiratory infection - Pt presents c/o cough x 1 months. Her vitals are stable she does not appear ill. HPI not suggestive of allergies or reflux. Plan to cover with Azithromycin. RTC in 5-7 days if no improvement.  - azithromycin (ZITHROMAX) 250 MG tablet; Take 2 tabs PO x 1 dose, then 1 tab PO QD x 4 days  Dispense: 6 tablet; Refill: 0  2. Cough - fluticasone (FLONASE) 50 MCG/ACT nasal spray; Place 2 sprays into both nostrils daily.  Dispense: 16 g; Refill: 12 - benzonatate (TESSALON) 100 MG capsule; Take 1-2 capsules (100-200 mg total) by mouth 3 (three) times daily as needed for cough.  Dispense: 40 capsule; Refill: 0 - Promethazine-Phenyleph-Codeine (PROMETHAZINE VC/CODEINE) 6.25-5-10 MG/5ML SYRP; Take 1 Dose by mouth every 6 (six) hours.  Dispense: 118 mL; Refill: 0   Whitney Temitope Griffing, PA-C  [facility] Care at [facility] 04/05/2018 9:55 AM

## 2018-04-17 ENCOUNTER — Ambulatory Visit: Payer: BLUE CROSS/BLUE SHIELD | Admitting: Physician Assistant

## 2018-04-17 ENCOUNTER — Encounter: Payer: Self-pay | Admitting: Physician Assistant

## 2018-04-17 ENCOUNTER — Other Ambulatory Visit: Payer: Self-pay

## 2018-04-17 VITALS — BP 115/71 | HR 72 | Temp 98.3°F | Resp 16 | Ht 60.0 in | Wt 141.8 lb

## 2018-04-17 DIAGNOSIS — I10 Essential (primary) hypertension: Secondary | ICD-10-CM

## 2018-04-17 NOTE — Progress Notes (Signed)
04/17/2018 9:59 AM   DOB: 1971-04-18 / MRN: [id number]  SUBJECTIVE:  Emily Green is a 47 y.o. female presenting for recheck hypertension.She has tried Norvasc 2.5.  I had started the patient on blood pressure medication at her urging given her mother had a stroke.  She was having some dizziness when I saw her last however this was in the setting of a sinusitis which seemed to improve with doxycycline.  She wonders if she should continue taking the medication today.  She has No Known Allergies.   She  has no past medical history on file.    She  reports that she has never smoked. She has never used smokeless tobacco. She reports that she does not drink alcohol or use drugs. She  has no sexual activity history on file. The patient  has a past surgical history that includes Abdominal hysterectomy.  Her family history includes Stroke in her mother.  Review of Systems  Constitutional: Negative for chills, diaphoresis and fever.  Eyes: Negative.   Respiratory: Negative for cough, hemoptysis, sputum production, shortness of breath and wheezing.   Cardiovascular: Negative for chest pain, orthopnea and leg swelling.  Gastrointestinal: Negative for nausea.  Skin: Negative for rash.  Neurological: Negative for dizziness, sensory change, speech change, focal weakness and headaches.    The problem list and medications were reviewed and updated by myself where necessary and exist elsewhere in the encounter.   OBJECTIVE:  BP 115/71   Pulse 72   Temp 98.3 F (36.8 C)   Resp 16   Ht 5' (1.524 m)   Wt 141 lb 12.8 oz (64.3 kg)   SpO2 97%   BMI 27.69 kg/m   Wt Readings from Last 3 Encounters:  04/17/18 141 lb 12.8 oz (64.3 kg)  04/05/18 141 lb (64 kg)  10/13/17 140 lb 12.8 oz (63.9 kg)   Temp Readings from Last 3 Encounters:  04/17/18 98.3 F (36.8 C)  04/05/18 98.3 F (36.8 C) (Oral)  01/18/17 98 F (36.7 C) (Oral)   BP Readings from Last 3 Encounters:  04/17/18 115/71  04/05/18  106/76  10/13/17 110/68   Pulse Readings from Last 3 Encounters:  04/17/18 72  04/05/18 80  10/13/17 64    Physical Exam  Constitutional: She is oriented to person, place, and time. She appears well-nourished.  Non-toxic appearance. No distress.  Eyes: Pupils are equal, round, and reactive to light. EOM are normal.  Cardiovascular: Normal rate, regular rhythm, S1 normal, S2 normal, normal heart sounds and intact distal pulses. Exam reveals no gallop, no friction rub and no decreased pulses.  No murmur heard. Pulmonary/Chest: Effort normal. No stridor. No respiratory distress. She has no wheezes. She has no rales.  Abdominal: She exhibits no distension.  Musculoskeletal: She exhibits no edema.  Neurological: She is alert and oriented to person, place, and time. No cranial nerve deficit. Gait normal.  Skin: Skin is warm and dry. She is not diaphoretic. No pallor.  Psychiatric: She has a normal mood and affect.  Vitals reviewed.   Lab Results  Component Value Date   HGBA1C 4.6 (L) 09/29/2017    Lab Results  Component Value Date   WBC 8.3 09/29/2017   HGB 12.3 09/29/2017   HCT 39.4 09/29/2017   MCV 74 (L) 09/29/2017   PLT 312 09/29/2017    Lab Results  Component Value Date   CREATININE 0.88 09/29/2017   BUN 12 09/29/2017   NA 140 09/29/2017   K 3.9  09/29/2017   CL 101 09/29/2017   CO2 25 09/29/2017    Lab Results  Component Value Date   ALT 16 09/29/2017   AST 24 09/29/2017   ALKPHOS 73 09/29/2017   BILITOT 0.7 09/29/2017    Lab Results  Component Value Date   TSH 1.700 09/29/2017    Lab Results  Component Value Date   CHOL 155 10/13/2017   HDL 34 (L) 10/13/2017   LDLCALC 69 10/13/2017   TRIG 259 (H) 10/13/2017   CHOLHDL 4.6 (H) 10/13/2017   The 10-year ASCVD risk score Denman George(Goff DC Jr., et al., 2013) is: 1.3%   Values used to calculate the score:     Age: [age] years     Sex: Female     Is Non-Hispanic African American: No     Diabetic: No     Tobacco  smoker: No     Systolic Blood Pressure: 115 mmHg     Is BP treated: Yes     HDL Cholesterol: 34 mg/dL     Total Cholesterol: 155 mg/dL    ASSESSMENT AND PLAN:  Emily Green was seen today for hypertension and cough.  Diagnoses and all orders for this visit:  Well-controlled hypertension: Advised patient that she can stop her blood pressure medication at this time.  She will continue to check her blood pressure about once a week and will start back if she is getting measures consistently greater than 140/90.  I discussed in detail how to check a blood pressure accurately today.  She tells me she will follow this closely.  We will see her back in about 6 months    The patient is advised to call or return to clinic if she does not see an improvement in symptoms, or to seek the care of the closest emergency department if she worsens with the above plan.   Deliah BostonMichael Jazion Atteberry, MHS, PA-C Primary Care at [facility] 04/17/2018 9:59 AM

## 2018-04-17 NOTE — Patient Instructions (Addendum)
  Hold your BP medication for now. If you pressure goes above 140/90 for more than 3 consecutive measures then start the medication back.  IF you received an x-ray today, you will receive an invoice from [facility]. Please contact [facility] at [phone] with questions or concerns regarding your invoice.   IF you received labwork today, you will receive an invoice from [facility]. Please contact [facility] at [phone] with questions or concerns regarding your invoice.   Our billing staff will not be able to assist you with questions regarding bills from these companies.  You will be contacted with the lab results as soon as they are available. The fastest way to get your results is to activate your My Chart account. Instructions are located on the last page of this paperwork. If you have not heard from us regarding the results in 2 weeks, please contact this office.

## 2018-10-20 ENCOUNTER — Ambulatory Visit: Payer: BLUE CROSS/BLUE SHIELD | Admitting: Emergency Medicine

## 2019-09-24 ENCOUNTER — Encounter: Payer: Self-pay | Admitting: Registered Nurse

## 2019-09-24 ENCOUNTER — Other Ambulatory Visit: Payer: Self-pay

## 2019-09-24 ENCOUNTER — Ambulatory Visit (INDEPENDENT_AMBULATORY_CARE_PROVIDER_SITE_OTHER): Payer: BLUE CROSS/BLUE SHIELD | Admitting: Registered Nurse

## 2019-09-24 VITALS — BP 130/84 | HR 68 | Temp 97.7°F | Resp 12 | Ht 62.0 in | Wt 143.0 lb

## 2019-09-24 DIAGNOSIS — I10 Essential (primary) hypertension: Secondary | ICD-10-CM | POA: Diagnosis not present

## 2019-09-24 DIAGNOSIS — Z130 Encounter for screening for diseases of the blood and blood-forming organs and certain disorders involving the immune mechanism: Secondary | ICD-10-CM

## 2019-09-24 DIAGNOSIS — Z7689 Persons encountering health services in other specified circumstances: Secondary | ICD-10-CM

## 2019-09-24 DIAGNOSIS — Z13220 Encounter for screening for lipoid disorders: Secondary | ICD-10-CM

## 2019-09-24 DIAGNOSIS — R42 Dizziness and giddiness: Secondary | ICD-10-CM

## 2019-09-24 DIAGNOSIS — Z13228 Encounter for screening for other metabolic disorders: Secondary | ICD-10-CM

## 2019-09-24 DIAGNOSIS — Z1329 Encounter for screening for other suspected endocrine disorder: Secondary | ICD-10-CM | POA: Diagnosis not present

## 2019-09-24 MED ORDER — AMLODIPINE BESYLATE 2.5 MG PO TABS
2.5000 mg | ORAL_TABLET | Freq: Every day | ORAL | 3 refills | Status: DC
Start: 2019-09-24 — End: 2020-08-26

## 2019-09-24 MED ORDER — MECLIZINE HCL 25 MG PO TABS: 25.0000 mg | ORAL_TABLET | Freq: Three times a day (TID) | ORAL | 0 refills | Status: AC | PRN

## 2019-09-24 NOTE — Patient Instructions (Signed)
Ms. Brogan -   I suspect your symptoms may be related to vertigo.  You may take the Antivert (Meclizine) as needed for dizziness up to 3 times daily.  Should your dizziness fail to improve in the coming months, please return to our clinic and we will reassess your symptoms  We have drawn these labs today: CBC, CMP, A1c, TSH, and Lipid Panel. Results are expected within about 1 week. I will call you with any concerns I have about the results.

## 2019-09-24 NOTE — Progress Notes (Signed)
Established Patient Office Visit  Subjective:  Patient ID: Emily Green, female    DOB: 1971/03/28  Age: 48 y.o. MRN: [id number]  CC:  Chief Complaint  Patient presents with  . Emesis    Pt stated--having diziness when getting up the chair, vomiting --3 weeks. Denied fever/headache. Pt went to urgent --in [place] --had ear infection on the left ear.    HPI Emily Green presents for visit to establish care and discuss recent infection  On 08/29/19 was seen at Urgent Care for otitis externa. Had also noted episodes of emesis and dizziness leading up to this. They have resolved since, but pt notes that she is concerned they will return.  Additionally, checks her BP at home regularly- sometimes after work, readings increase to 150/99 at their peak. She has a family history of stroke and wants to avoid this if possible - very interested in restarting her amlodipine 2.5mg  PO qd  Otherwise, no complaints. Feels generally well today.  History reviewed. No pertinent past medical history.  Past Surgical History:  Procedure Laterality Date  . ABDOMINAL HYSTERECTOMY      Family History  Problem Relation Age of Onset  . Stroke Mother     Social History   Socioeconomic History  . Marital status: Single    Spouse name: Not on file  . Number of children: Not on file  . Years of education: Not on file  . Highest education level: Not on file  Occupational History  . Not on file  Social Needs  . Financial resource strain: Not hard at all  . Food insecurity    Worry: Never true    Inability: Never true  . Transportation needs    Medical: No    Non-medical: No  Tobacco Use  . Smoking status: Never Smoker  . Smokeless tobacco: Never Used  Substance and Sexual Activity  . Alcohol use: No  . Drug use: No  . Sexual activity: Not Currently    Birth control/protection: Surgical    Comment: hysterectomy  Lifestyle  . Physical activity    Days per week: 3 days    Minutes per  session: 30 min  . Stress: To some extent  Relationships  . Social Musician on phone: Three times a week    Gets together: Twice a week    Attends religious service: Patient refused    Active member of club or organization: Patient refused    Attends meetings of clubs or organizations: Patient refused    Relationship status: Patient refused  . Intimate partner violence    Fear of current or ex partner: No    Emotionally abused: No    Physically abused: No    Forced sexual activity: Not on file  Other Topics Concern  . Not on file  Social History Narrative  . Not on file    Outpatient Medications Prior to Visit  Medication Sig Dispense Refill  . amLODipine (NORVASC) 2.5 MG tablet Take 1 tablet (2.5 mg total) by mouth daily. 90 tablet 3  . fluticasone (FLONASE) 50 MCG/ACT nasal spray Place 2 sprays into both nostrils daily. 16 g 12   No facility-administered medications prior to visit.     No Known Allergies  ROS Review of Systems  Constitutional: Negative.   HENT: Negative.   Eyes: Negative.  Negative for visual disturbance.  Respiratory: Negative.   Cardiovascular: Negative.  Negative for chest pain, palpitations and leg swelling.  Gastrointestinal: Negative.  Negative  for abdominal pain, diarrhea, nausea and vomiting.  Endocrine: Negative.   Genitourinary: Negative.   Musculoskeletal: Negative.   Skin: Negative.   Allergic/Immunologic: Negative.   Neurological: Negative.   Hematological: Negative.   Psychiatric/Behavioral: Negative.   All other systems reviewed and are negative.     Objective:    Physical Exam  Constitutional: She is oriented to person, place, and time. She appears well-developed and well-nourished. No distress.  Cardiovascular: Normal rate and regular rhythm.  Pulmonary/Chest: Effort normal. No respiratory distress.  Neurological: She is alert and oriented to person, place, and time.  Skin: Skin is warm and dry. No rash noted.  She is not diaphoretic. No erythema. No pallor.  Psychiatric: She has a normal mood and affect. Her behavior is normal. Judgment and thought content normal.  Nursing note and vitals reviewed.   BP 130/84 (BP Location: Right Arm, Patient Position: Sitting, Cuff Size: Normal)   Pulse 68   Temp 97.7 F (36.5 C)   Resp 12   Ht 5\' 2"  (1.575 m)   Wt 143 lb (64.9 kg)   SpO2 97%   BMI 26.16 kg/m  Wt Readings from Last 3 Encounters:  09/24/19 143 lb (64.9 kg)  04/17/18 141 lb 12.8 oz (64.3 kg)  04/05/18 141 lb (64 kg)     Health Maintenance Due  Topic Date Due  . HIV Screening  03/12/1987  . TETANUS/TDAP  03/12/1991  . PAP SMEAR-Modifier  03/11/1993  . INFLUENZA VACCINE  05/19/2019    There are no preventive care reminders to display for this patient.  Lab Results  Component Value Date   TSH 1.700 09/29/2017   Lab Results  Component Value Date   WBC 8.3 09/29/2017   HGB 12.3 09/29/2017   HCT 39.4 09/29/2017   MCV 74 (L) 09/29/2017   PLT 312 09/29/2017   Lab Results  Component Value Date   NA 140 09/29/2017   K 3.9 09/29/2017   CO2 25 09/29/2017   GLUCOSE 95 09/29/2017   BUN 12 09/29/2017   CREATININE 0.88 09/29/2017   BILITOT 0.7 09/29/2017   ALKPHOS 73 09/29/2017   AST 24 09/29/2017   ALT 16 09/29/2017   PROT 7.8 09/29/2017   ALBUMIN 4.6 09/29/2017   CALCIUM 9.7 09/29/2017   Lab Results  Component Value Date   CHOL 155 10/13/2017   Lab Results  Component Value Date   HDL 34 (L) 10/13/2017   Lab Results  Component Value Date   LDLCALC 69 10/13/2017   Lab Results  Component Value Date   TRIG 259 (H) 10/13/2017   Lab Results  Component Value Date   CHOLHDL 4.6 (H) 10/13/2017   Lab Results  Component Value Date   HGBA1C 4.6 (L) 09/29/2017      Assessment & Plan:   Problem List Items Addressed This Visit    None    Visit Diagnoses    Encounter to establish care    -  Primary   Well-controlled hypertension       Relevant Medications    amLODipine (NORVASC) 2.5 MG tablet   Screening for endocrine, metabolic and immunity disorder       Relevant Orders   CBC   Comprehensive metabolic panel   Hemoglobin A1c   TSH   Lipid screening       Relevant Orders   Lipid panel   Dizziness and giddiness       Relevant Medications   meclizine (ANTIVERT) 25 MG tablet  Meds ordered this encounter  Medications  . amLODipine (NORVASC) 2.5 MG tablet    Sig: Take 1 tablet (2.5 mg total) by mouth daily.    Dispense:  90 tablet    Refill:  3    Order Specific Question:   Supervising Provider    Answer:   Delia Chimes A [id number]  . meclizine (ANTIVERT) 25 MG tablet    Sig: Take 1 tablet (25 mg total) by mouth 3 (three) times daily as needed for dizziness.    Dispense:  60 tablet    Refill:  0    Order Specific Question:   Supervising Provider    Answer:   Forrest Moron [id number]    Follow-up: Return if symptoms worsen or fail to improve.   PLAN  Dizziness and emesis: BPPV vs. Infectious gastroenteritis vs. GERD vs. 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 Others possible given patient's limited history and benign exam.  Will trial meclizine 25mg  PO tid PRN for dizziness.   Restart amlodipine 2.5mg  PO qd for well controlled HTN  Return in 1 year for CPE and labs  Patient encouraged to call clinic with any questions, comments, or concerns.   Maximiano Coss, NP

## 2019-09-25 LAB — COMPREHENSIVE METABOLIC PANEL
ALT: 32 IU/L (ref 0–32)
AST: 28 IU/L (ref 0–40)
Albumin/Globulin Ratio: 1.4 (ref 1.2–2.2)
Albumin: 4.5 g/dL (ref 3.8–4.8)
Alkaline Phosphatase: 82 IU/L (ref 39–117)
BUN/Creatinine Ratio: 16 (ref 9–23)
BUN: 15 mg/dL (ref 6–24)
Bilirubin Total: 0.7 mg/dL (ref 0.0–1.2)
CO2: 22 mmol/L (ref 20–29)
Calcium: 9.9 mg/dL (ref 8.7–10.2)
Chloride: 103 mmol/L (ref 96–106)
Creatinine, Ser: 0.94 mg/dL (ref 0.57–1.00)
GFR calc Af Amer: 84 mL/min/{1.73_m2} (ref >59)
GFR calc non Af Amer: 72 mL/min/{1.73_m2} (ref >59)
Globulin, Total: 3.2 g/dL (ref 1.5–4.5)
Glucose: 90 mg/dL (ref 65–99)
Potassium: 4.5 mmol/L (ref 3.5–5.2)
Sodium: 139 mmol/L (ref 134–144)
Total Protein: 7.7 g/dL (ref 6.0–8.5)

## 2019-09-25 LAB — LIPID PANEL
Chol/HDL Ratio: 5 ratio — ABNORMAL HIGH (ref 0.0–4.4)
Cholesterol, Total: 209 mg/dL — ABNORMAL HIGH (ref 100–199)
HDL: 42 mg/dL (ref >39)
LDL Chol Calc (NIH): 83 mg/dL (ref 0–99)
Triglycerides: 522 mg/dL — ABNORMAL HIGH (ref 0–149)
VLDL Cholesterol Cal: 84 mg/dL — ABNORMAL HIGH (ref 5–40)

## 2019-09-25 LAB — CBC
Hematocrit: 41.7 % (ref 34.0–46.6)
Hemoglobin: 12.3 g/dL (ref 11.1–15.9)
MCH: 21.8 pg — ABNORMAL LOW (ref 26.6–33.0)
MCHC: 29.5 g/dL — ABNORMAL LOW (ref 31.5–35.7)
MCV: 74 fL — ABNORMAL LOW (ref 79–97)
Platelets: 308 10*3/uL (ref 150–450)
RBC: 5.64 x10E6/uL — ABNORMAL HIGH (ref 3.77–5.28)
RDW: 15.1 % (ref 11.7–15.4)
WBC: 6.6 10*3/uL (ref 3.4–10.8)

## 2019-09-25 LAB — HEMOGLOBIN A1C
Est. average glucose Bld gHb Est-mCnc: 100 mg/dL
Hgb A1c MFr Bld: 5.1 % (ref 4.8–5.6)

## 2019-09-25 LAB — TSH: TSH: 1.16 u[IU]/mL (ref 0.450–4.500)

## 2019-09-26 ENCOUNTER — Encounter: Payer: Self-pay | Admitting: Registered Nurse

## 2019-09-26 NOTE — Progress Notes (Signed)
Good evening,   Normal results letter, please!  Thanks,  Kathrin Ruddy, NP

## 2019-09-27 ENCOUNTER — Encounter: Payer: Self-pay | Admitting: Radiology

## 2020-08-25 ENCOUNTER — Other Ambulatory Visit: Payer: Self-pay | Admitting: Registered Nurse

## 2020-08-25 DIAGNOSIS — I10 Essential (primary) hypertension: Secondary | ICD-10-CM

## 2020-08-26 NOTE — Telephone Encounter (Signed)
Requested medication (s) are due for refill today: no  Requested medication (s) are on the active medication list: yes   Last refill:  06/27/2020  Future visit scheduled:no  Notes to clinic:  overdue for follow up appointment    Requested Prescriptions  Pending Prescriptions Disp Refills   amLODipine (NORVASC) 2.5 MG tablet [Pharmacy Med Name: AMLODIPINE BESYLATE 2.5MG  TABLETS] 90 tablet 3    Sig: TAKE 1 TABLET(2.5 MG) BY MOUTH DAILY      Cardiovascular:  Calcium Channel Blockers Failed - 08/25/2020  7:12 PM      Failed - Valid encounter within last 6 months    Recent Outpatient Visits           11 months ago Encounter to establish care   [facility], Gerlene Burdock, NP   2 years ago Well-controlled hypertension   [facility], Marolyn Hammock, PA-C   2 years ago Lower respiratory infection   [facility] at [facility], Madelaine Bhat, PA-C   2 years ago Well-controlled hypertension   [facility], Marolyn Hammock, PA-C   2 years ago Acute nonintractable headache, unspecified headache type   [facility] at [facility], Marolyn Hammock, PA-C              Passed - Last BP in normal range    BP Readings from Last 1 Encounters:  09/24/19 130/84

## 2020-08-26 NOTE — Telephone Encounter (Signed)
Pt needs f/u visit almost been 1 year. Refill will be sent

## 2020-08-28 NOTE — Telephone Encounter (Signed)
Pt has already had curtesy refill she cannot have another until she has been seen

## 2020-08-28 NOTE — Telephone Encounter (Signed)
08/28/2020 - PATIENT REQUESTING A REFILL ON HER AMLODIPINE 2.5 mg. I TRIED TO CALL AND SCHEDULE AN OFFICE VISIT WITH RICH MORROW BUT PATIENT'S CELL AND HOME PHONE NUMBER SAYS THAT MY CALL CAN NOT BE COMPLETED AT THIS TIME. I WILL ROUTE BACK TO THE CLINICAL TEAM FOR REVIEW. MBC

## 2021-07-27 ENCOUNTER — Other Ambulatory Visit: Payer: Self-pay | Admitting: Registered Nurse

## 2021-07-27 DIAGNOSIS — I10 Essential (primary) hypertension: Secondary | ICD-10-CM

## 2021-07-27 NOTE — Telephone Encounter (Signed)
Patient will need a appointment for any refills she has not been seen in office since 2020

## 2021-08-10 NOTE — Telephone Encounter (Signed)
Tried to call the patients voicemail on cell phone and it was not set up yet and the house phone does not have a voice mail will try and reach back out

## 2021-08-18 ENCOUNTER — Other Ambulatory Visit: Payer: Self-pay | Admitting: Internal Medicine

## 2021-08-19 LAB — LIPID PANEL
Cholesterol: 262 mg/dL — ABNORMAL HIGH (ref <200)
HDL: 44 mg/dL — ABNORMAL LOW (ref >=50)
Non-HDL Cholesterol (Calc): 218 mg/dL (calc) — ABNORMAL HIGH (ref <130)
Total CHOL/HDL Ratio: 6 (calc) — ABNORMAL HIGH (ref <5.0)
Triglycerides: 1262 mg/dL — ABNORMAL HIGH (ref <150)

## 2021-08-19 LAB — COMPLETE METABOLIC PANEL WITH GFR
AG Ratio: 1.5 (calc) (ref 1.0–2.5)
ALT: 24 U/L (ref 6–29)
AST: 20 U/L (ref 10–35)
Albumin: 4.4 g/dL (ref 3.6–5.1)
Alkaline phosphatase (APISO): 69 U/L (ref 31–125)
BUN/Creatinine Ratio: 15 (calc) (ref 6–22)
BUN: 15 mg/dL (ref 7–25)
CO2: 19 mmol/L — ABNORMAL LOW (ref 20–32)
Calcium: 9.5 mg/dL (ref 8.6–10.2)
Chloride: 103 mmol/L (ref 98–110)
Creat: 1.02 mg/dL — ABNORMAL HIGH (ref 0.50–0.99)
Globulin: 3 g/dL (calc) (ref 1.9–3.7)
Glucose, Bld: 131 mg/dL — ABNORMAL HIGH (ref 65–99)
Potassium: 4.1 mmol/L (ref 3.5–5.3)
Sodium: 138 mmol/L (ref 135–146)
Total Bilirubin: 0.8 mg/dL (ref 0.2–1.2)
Total Protein: 7.4 g/dL (ref 6.1–8.1)
eGFR: 67 mL/min/{1.73_m2} (ref >=60)

## 2021-08-19 LAB — CBC
HCT: 40.3 % (ref 35.0–45.0)
Hemoglobin: 12.2 g/dL (ref 11.7–15.5)
MCH: 21.9 pg — ABNORMAL LOW (ref 27.0–33.0)
MCHC: 30.3 g/dL — ABNORMAL LOW (ref 32.0–36.0)
MCV: 72.4 fL — ABNORMAL LOW (ref 80.0–100.0)
MPV: 10.2 fL (ref 7.5–12.5)
Platelets: 330 10*3/uL (ref 140–400)
RBC: 5.57 10*6/uL — ABNORMAL HIGH (ref 3.80–5.10)
RDW: 16.1 % — ABNORMAL HIGH (ref 11.0–15.0)
WBC: 7.7 10*3/uL (ref 3.8–10.8)

## 2021-08-19 LAB — VITAMIN B12: Vitamin B-12: 399 pg/mL (ref 200–1100)

## 2021-08-19 LAB — FOLATE: Folate: 8.6 ng/mL

## 2021-08-19 LAB — VITAMIN D 25 HYDROXY (VIT D DEFICIENCY, FRACTURES): Vit D, 25-Hydroxy: 15 ng/mL — ABNORMAL LOW (ref 30–100)

## 2023-07-08 LAB — COLOGUARD: COLOGUARD: NEGATIVE
